# Patient Record
Sex: Female | Born: 1995 | Race: Black or African American | Hispanic: No | Marital: Single | Smoking: Never smoker
Health system: Southern US, Community
[De-identification: ages and names within clinical notes are randomized; demographics above are authoritative.]

---

## 2017-09-25 ENCOUNTER — Other Ambulatory Visit: Payer: Self-pay

## 2017-09-25 ENCOUNTER — Emergency Department (HOSPITAL_COMMUNITY)
Admission: EM | Admit: 2017-09-25 | Discharge: 2017-09-25 | Disposition: A | Discharge status: Home / Self Care | Payer: BLUE CROSS/BLUE SHIELD | Attending: Emergency Medicine | Admitting: Emergency Medicine

## 2017-09-25 ENCOUNTER — Emergency Department (HOSPITAL_COMMUNITY): Payer: BLUE CROSS/BLUE SHIELD

## 2017-09-25 ENCOUNTER — Encounter (HOSPITAL_COMMUNITY): Payer: Self-pay | Admitting: Emergency Medicine

## 2017-09-25 DIAGNOSIS — S90212A Contusion of left great toe with damage to nail, initial encounter: Secondary | ICD-10-CM | POA: Diagnosis not present

## 2017-09-25 DIAGNOSIS — Y9389 Activity, other specified: Secondary | ICD-10-CM | POA: Diagnosis not present

## 2017-09-25 DIAGNOSIS — S99922A Unspecified injury of left foot, initial encounter: Secondary | ICD-10-CM | POA: Diagnosis present

## 2017-09-25 DIAGNOSIS — Y999 Unspecified external cause status: Secondary | ICD-10-CM | POA: Insufficient documentation

## 2017-09-25 DIAGNOSIS — S97112A Crushing injury of left great toe, initial encounter: Secondary | ICD-10-CM | POA: Diagnosis not present

## 2017-09-25 DIAGNOSIS — Y9289 Other specified places as the place of occurrence of the external cause: Secondary | ICD-10-CM | POA: Diagnosis not present

## 2017-09-25 DIAGNOSIS — W52XXXA Crushed, pushed or stepped on by crowd or human stampede, initial encounter: Secondary | ICD-10-CM | POA: Diagnosis not present

## 2017-09-25 MED ORDER — BACITRACIN ZINC 500 UNIT/GM EX OINT
TOPICAL_OINTMENT | CUTANEOUS | Status: AC
  Administered 2017-09-25: 05:00:00
  Filled 2017-09-25: qty 0.9

## 2017-09-25 MED ORDER — ACETAMINOPHEN 500 MG PO TABS
1000.0000 mg | ORAL_TABLET | Freq: Once | ORAL | Status: AC
  Administered 2017-09-25: 1000 mg via ORAL
  Filled 2017-09-25: qty 2

## 2017-09-25 NOTE — Discharge Instructions (Addendum)
Keep the wound clean and dry.  Return for redness, pus, or fever.

## 2017-09-25 NOTE — ED Triage Notes (Signed)
Pt reports being at a party an hour ago and someone stepped on her left great toe. Dried blood noted around toe nail.

## 2017-09-25 NOTE — ED Provider Notes (Signed)
[facility]-EMERGENCY DEPT Provider Note   CSN: [id number] Arrival date & time: 09/25/17  0154     History   Chief Complaint Chief Complaint  Patient presents with  . Toe Injury    HPI Abigail Hobbs is a 22 y.o. female.  Patient presents to the ED with a chief complaint of left great toe pain.  She states that she was at a party and had her toe stepped on.  She complains of some bleeding.  She denies having taken anything for her symptoms.  It is worse with palpation and movement.  The history is provided by the patient. No language interpreter was used.    History reviewed. No pertinent past medical history.  There are no active problems to display for this patient.   History reviewed. No pertinent surgical history.   OB History   None      Home Medications    Prior to Admission medications   Not on File    Family History History reviewed. No pertinent family history.  Social History Social History   Tobacco Use  . Smoking status: Never Smoker  . Smokeless tobacco: Never Used  Substance Use Topics  . Alcohol use: Never    Frequency: Never  . Drug use: Never     Allergies   Nickel   Review of Systems Review of Systems  All other systems reviewed and are negative.    Physical Exam Updated Vital Signs BP (!) 141/87 (BP Location: Left Arm)   Pulse (!) 119   Temp 98.9 F (37.2 C) (Oral)   Resp 18   Ht 5\' 3"  (1.6 m)   Wt 95.3 kg (210 lb)   LMP 09/14/2017   SpO2 97%   BMI 37.20 kg/m   Physical Exam  Constitutional: She is oriented to person, place, and time. No distress.  HENT:  Head: Normocephalic and atraumatic.  Eyes: Pupils are equal, round, and reactive to light. Conjunctivae and EOM are normal.  Neck: No tracheal deviation present.  Cardiovascular: Normal rate.  Pulmonary/Chest: Effort normal. No respiratory distress.  Abdominal: Soft.  Musculoskeletal: Normal range of motion.  Neurological: She is alert and  oriented to person, place, and time.  Skin: Skin is warm and dry. She is not diaphoretic.  Probable mild subungual hematoma to left great toe, no bony abnormality or deformity, no visible avulsion of the nail  Psychiatric: Judgment normal.  Nursing note and vitals reviewed.    ED Treatments / Results  Labs (all labs ordered are listed, but only abnormal results are displayed) Labs Reviewed - No data to display  EKG None  Radiology Dg Foot Complete Left  Result Date: 09/25/2017 CLINICAL DATA:  Toe injury EXAM: LEFT FOOT - COMPLETE 3+ VIEW COMPARISON:  None. FINDINGS: There is no evidence of fracture or dislocation. There is no evidence of arthropathy or other focal bone abnormality. Soft tissues are unremarkable. Probable os along the dorsal aspect of the navicular. IMPRESSION: No acute osseous abnormality. Electronically Signed   By: Jasmine PangKim  Fujinaga M.D.   On: 09/25/2017 03:03    Procedures Procedures (including critical care time)  Medications Ordered in ED Medications - No data to display   Initial Impression / Assessment and Plan / ED Course  I have reviewed the triage vital signs and the nursing notes.  Pertinent labs & imaging results that were available during my care of the patient were reviewed by me and considered in my medical decision making (see chart for  details).     Patient with toe pain.  Mild subungual hematoma.  Blood is dried.  Will not drill toenail.  DC to home.  Final Clinical Impressions(s) / ED Diagnoses   Final diagnoses:  Injury of toe on left foot, initial encounter    ED Discharge Orders    None       Roxy Horseman, PA-C 09/25/17 0430    Ward, Layla Maw, DO 09/25/17 [phone]

## 2019-02-08 IMAGING — CR DG FOOT COMPLETE 3+V*L*
3 series · 3 of 3 positions shown · non-contrast
Comparison: None.

CLINICAL DATA: Toe injury

EXAM:
LEFT FOOT - COMPLETE 3+ VIEW

[x foot ap left]
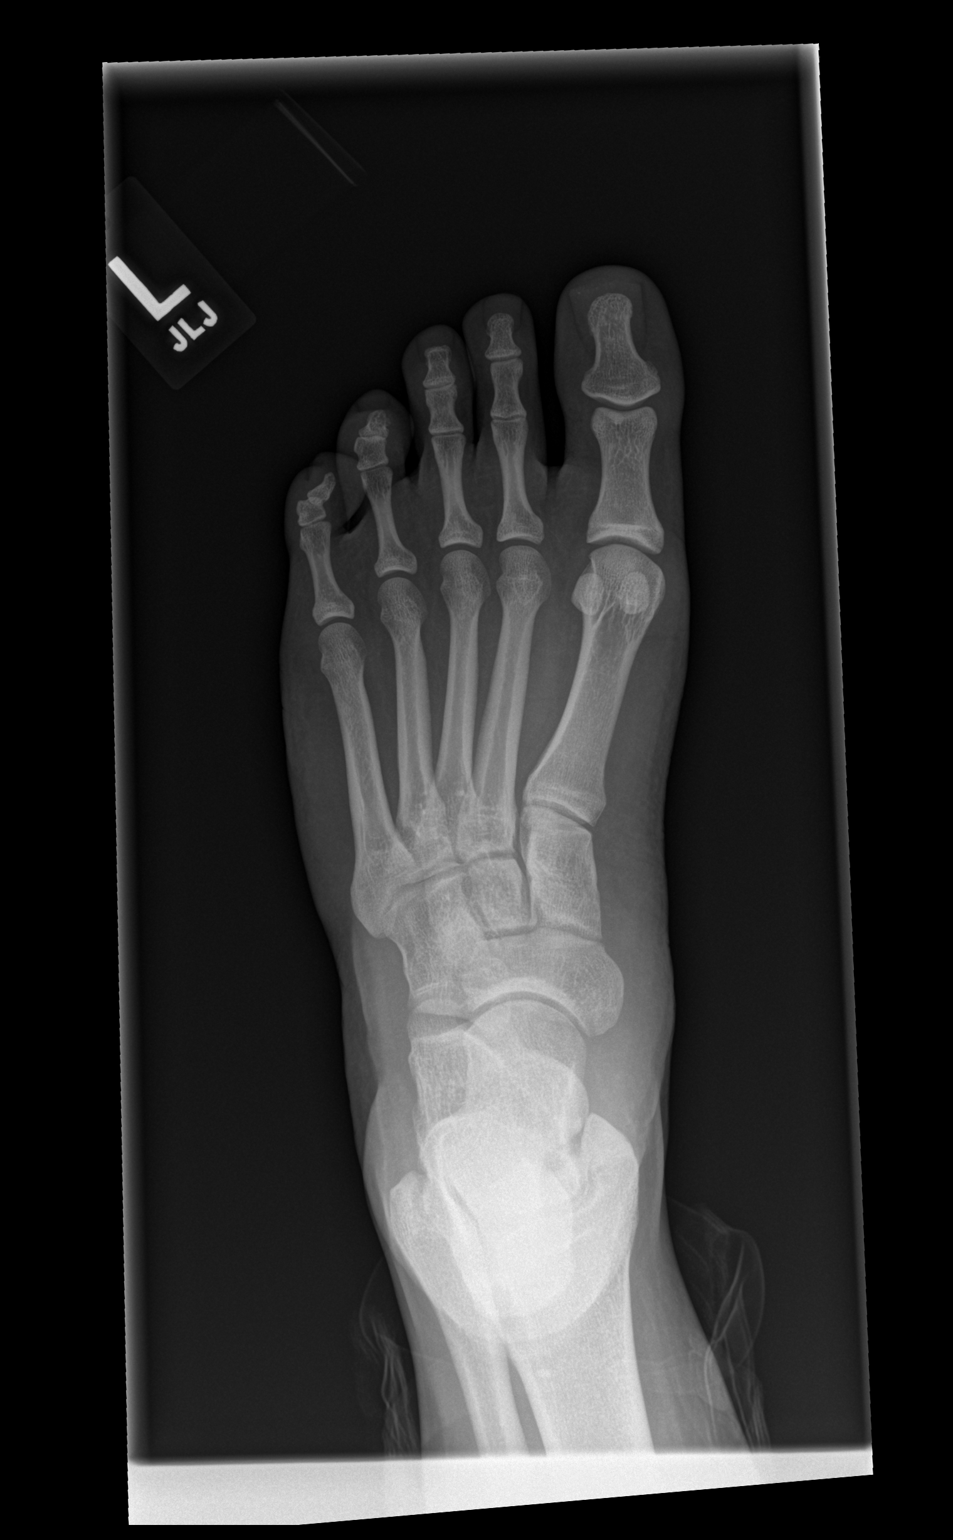

[x foot obl left]
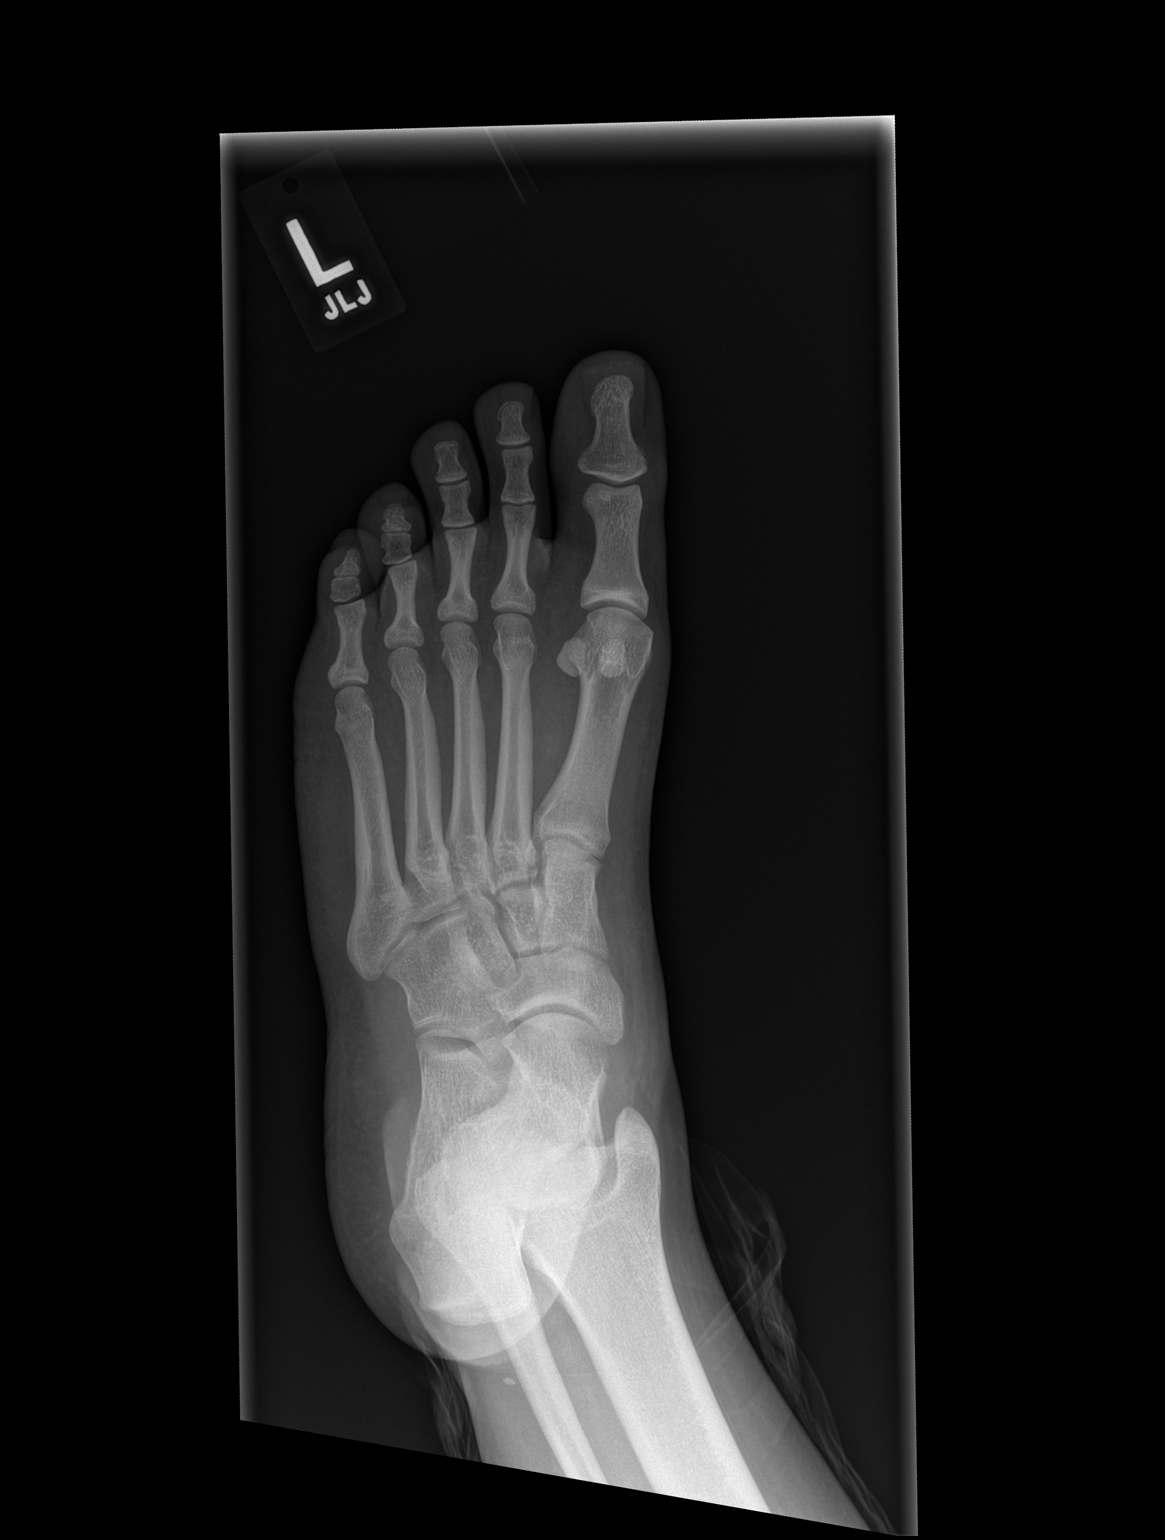

[x foot lat left]
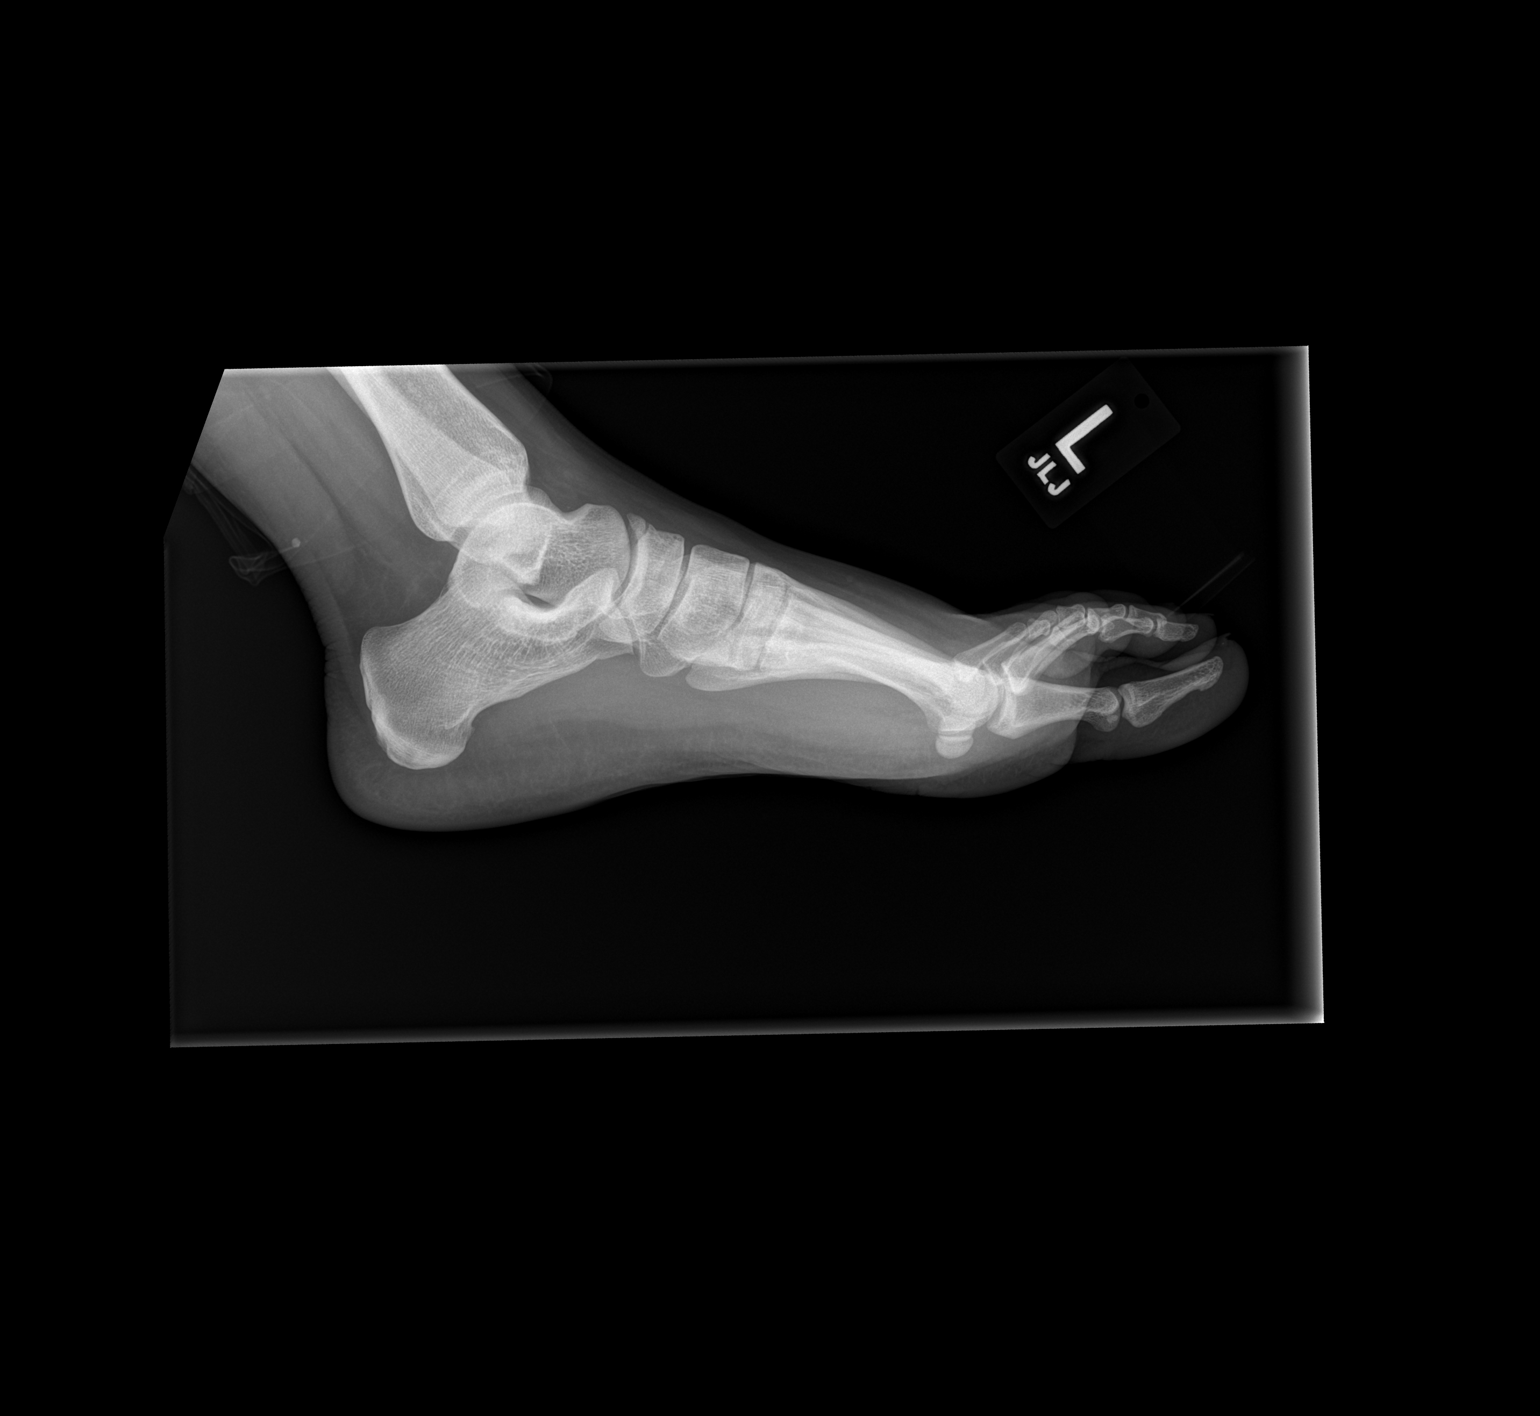

[3 of 3 positions shown; findings below may reference images not displayed]

FINDINGS: There is no evidence of fracture or dislocation. There is no
evidence of arthropathy or other focal bone abnormality. Soft
tissues are unremarkable. Probable os along the dorsal aspect of the
navicular.
IMPRESSION: No acute osseous abnormality.

## 2019-08-31 ENCOUNTER — Ambulatory Visit: Payer: BC Managed Care – PPO | Attending: Internal Medicine

## 2019-08-31 DIAGNOSIS — Z23 Encounter for immunization: Secondary | ICD-10-CM

## 2019-08-31 NOTE — Progress Notes (Signed)
   Covid-19 Vaccination Clinic  Name:  Abigail Hobbs    MRN: [id number] DOB: Jan 27, 1996  08/31/2019  [name] was observed post Covid-19 immunization for 15 minutes without incident. She was provided with Vaccine Information Sheet and instruction to access the V-Safe system.   [name] was instructed to call 911 with any severe reactions post vaccine: Marland Kitchen Difficulty breathing  . Swelling of face and throat  . A fast heartbeat  . A bad rash all over body  . Dizziness and weakness   Immunizations Administered    Name Date Dose VIS Date Route   Pfizer COVID-19 Vaccine 08/31/2019 11:06 AM 0.3 mL 05/18/2019 Intramuscular   Manufacturer: [employer], [employer]   Lot: [id number]   NDC: [address]-7

## 2019-09-26 ENCOUNTER — Ambulatory Visit: Payer: BC Managed Care – PPO | Attending: Internal Medicine

## 2019-09-26 DIAGNOSIS — Z23 Encounter for immunization: Secondary | ICD-10-CM

## 2019-09-26 NOTE — Progress Notes (Signed)
   Covid-19 Vaccination Clinic  Name:  Abigail Hobbs    MRN: [id number] DOB: [birth year]  09/26/2019  Ms. Abigail Hobbs was observed post Covid-19 immunization for 15 minutes without incident. She was provided with Vaccine Information Sheet and instruction to access the V-Safe system.   Ms. Abigail Hobbs was instructed to call 911 with any severe reactions post vaccine: Marland Kitchen Difficulty breathing  . Swelling of face and throat  . A fast heartbeat  . A bad rash all over body  . Dizziness and weakness   Immunizations Administered    Name Date Dose VIS Date Route   Pfizer COVID-19 Vaccine 09/26/2019 10:55 AM 0.3 mL 08/01/2018 Intramuscular   Manufacturer: [employer], [employer]   Lot: [id number]   NDC: [address]-4
